# Patient Record
Sex: Female | Born: 1940 | Race: Black or African American | Hispanic: No | Smoking: Current every day smoker
Health system: Southern US, Community
[De-identification: ages and names within clinical notes are randomized; demographics above are authoritative.]

## PROBLEM LIST (undated history)

## (undated) DIAGNOSIS — I10 Essential (primary) hypertension: Secondary | ICD-10-CM

## (undated) DIAGNOSIS — J449 Chronic obstructive pulmonary disease, unspecified: Secondary | ICD-10-CM

---

## 2016-06-15 ENCOUNTER — Ambulatory Visit (HOSPITAL_COMMUNITY)
Admission: EM | Admit: 2016-06-15 | Discharge: 2016-06-15 | Disposition: A | Discharge status: Home / Self Care | Payer: Medicare Other | Attending: Family Medicine | Admitting: Family Medicine

## 2016-06-15 ENCOUNTER — Ambulatory Visit (INDEPENDENT_AMBULATORY_CARE_PROVIDER_SITE_OTHER): Payer: Medicare Other

## 2016-06-15 ENCOUNTER — Encounter (HOSPITAL_COMMUNITY): Payer: Self-pay | Admitting: Family Medicine

## 2016-06-15 DIAGNOSIS — S2232XG Fracture of one rib, left side, subsequent encounter for fracture with delayed healing: Secondary | ICD-10-CM

## 2016-06-15 HISTORY — DX: Essential (primary) hypertension: I10

## 2016-06-15 HISTORY — DX: Chronic obstructive pulmonary disease, unspecified: J44.9

## 2016-06-15 MED ORDER — MORPHINE SULFATE (PF) 2 MG/ML IV SOLN
INTRAVENOUS | Status: AC
  Filled 2016-06-15: qty 1

## 2016-06-15 MED ORDER — IBUPROFEN 800 MG PO TABS: 800.0000 mg | ORAL_TABLET | Freq: Three times a day (TID) | ORAL | 0 refills | Status: AC | PRN

## 2016-06-15 MED ORDER — MORPHINE SULFATE (PF) 2 MG/ML IV SOLN
2.0000 mg | Freq: Once | INTRAVENOUS | Status: AC
  Administered 2016-06-15: 2 mg via INTRAMUSCULAR

## 2016-06-15 MED ORDER — HYDROCODONE-ACETAMINOPHEN 5-325 MG PO TABS: 1.0000 | ORAL_TABLET | Freq: Four times a day (QID) | ORAL | 0 refills | Status: AC | PRN

## 2016-06-15 NOTE — ED Provider Notes (Signed)
CSN: [id number]     Arrival date & time 06/15/16  1046 History   First MD Initiated Contact with Patient 06/15/16 1211     Chief Complaint  Patient presents with  . Fall   (Consider location/radiation/quality/duration/timing/severity/associated sxs/prior Treatment) Mrs. Lemler is a 75 y.o female with history of HTN and COPD. She tripped and fell this morning on the concrete driveway, landed on her left side, and now presents to Urgent Care complaining of severe left rib pain, left shoulder pain, left face pain, and worsening in her chronic low back pain. Patient had no LOC. She denies headache, dizziness, or weakness. She is not on any blood thinners. She reports her rib pain is the most bothersome. She reports it hurts to move and hurts to breath. She rates her current pain as 8/10.       Past Medical History:  Diagnosis Date  . COPD (chronic obstructive pulmonary disease) (HCC)   . Hypertension    History reviewed. No pertinent surgical history. History reviewed. No pertinent family history. Social History  Substance Use Topics  . Smoking status: Current Every Day Smoker  . Smokeless tobacco: Never Used  . Alcohol use Not on file   OB History    No data available     Review of Systems  Constitutional: Negative for chills, fatigue and fever.  Respiratory: Positive for cough and shortness of breath.        Normally stays out of breath with coughing due to her COPD   Cardiovascular: Negative for chest pain and palpitations.  Gastrointestinal: Positive for abdominal pain. Negative for diarrhea, nausea and vomiting.  Musculoskeletal:       Positive for left rib pain, worsening in her chronic back pain, and left shoulder pain. Reports the rib pain is the most bothersome  Neurological: Negative for dizziness, weakness and headaches.    Allergies  Codeine and Sulfa antibiotics  Home Medications   Prior to Admission medications   Medication Sig Start Date End Date Taking?  Authorizing Provider  albuterol (PROVENTIL HFA;VENTOLIN HFA) 108 (90 Base) MCG/ACT inhaler Inhale into the lungs every 6 (six) hours as needed for wheezing or shortness of breath.   Yes Historical Provider, MD  FLUoxetine (PROZAC) 10 MG capsule Take 10 mg by mouth daily.   Yes Historical Provider, MD  hydrOXYzine (ATARAX/VISTARIL) 25 MG tablet Take 25 mg by mouth 1 day or 1 dose.   Yes Historical Provider, MD  lisinopril (PRINIVIL,ZESTRIL) 10 MG tablet Take 10 mg by mouth daily.   Yes Historical Provider, MD  meloxicam (MOBIC) 7.5 MG tablet Take 7.5 mg by mouth daily.   Yes Historical Provider, MD  omeprazole (PRILOSEC) 20 MG capsule Take 20 mg by mouth 2 (two) times daily before a meal.   Yes Historical Provider, MD  HYDROcodone-acetaminophen (NORCO/VICODIN) 5-325 MG tablet Take 1 tablet by mouth every 6 (six) hours as needed. 06/15/16 06/20/16  Lucia Estelle, NP  ibuprofen (ADVIL,MOTRIN) 800 MG tablet Take 1 tablet (800 mg total) by mouth 3 (three) times daily as needed. 06/15/16 06/20/16  Lucia Estelle, NP   Meds Ordered and Administered this Visit   Medications  morphine 2 MG/ML injection 2 mg (2 mg Intramuscular Given 06/15/16 1228)    BP 175/65   Pulse 76   Temp 98.6 F (37 C)   Resp 18   SpO2 100%  No data found.   Physical Exam  Constitutional: She is oriented to person, place, and time. She appears well-developed and well-nourished.  Appears to be in a lot of pain  HENT:  Head: Normocephalic and atraumatic.  Small amount of swelling and bruising noted over the left zygomatic bone region.   Eyes: EOM are normal. Pupils are equal, round, and reactive to light.  Neck: Normal range of motion. Neck supple.  Cardiovascular: Normal rate, regular rhythm and normal heart sounds.   Pulmonary/Chest: Effort normal and breath sounds normal. No respiratory distress.  Abdominal: Soft. Bowel sounds are normal. There is no tenderness.  Musculoskeletal:  No shoulder or rib deformity noted. No  swelling noted. Has severe tenderness and pain upon palpation over her left rib cage, has severe pain with left shoulder ROM; however does have passive full ROM  Neurological: She is alert and oriented to person, place, and time. No cranial nerve deficit.  Skin: Skin is warm.  Nursing note and vitals reviewed.   Urgent Care Course   Clinical Course    Procedures (including critical care time)  Labs Review Labs Reviewed - No data to display  Imaging Review Dg Ribs Unilateral W/chest Left  Result Date: 06/15/2016 CLINICAL DATA:  Patient fell this morning on concrete with left-sided rib pain. EXAM: LEFT RIBS AND CHEST - 3+ VIEW COMPARISON:  None. FINDINGS: The lungs are clear wiithout focal pneumonia, edema, pneumothorax or pleural effusion. Interstitial markings are diffusely coarsened with chronic features. Cardiopericardial silhouette is at upper limits of normal for size. Radio-opaque marker has been placed on the skin at the site of patient concern. Oblique views of the left ribs show an apparent nonacute fracture of the posterior lateral left fourth rib, a location remote from the localization marker. No acute left-sided rib fracture is identified. IMPRESSION: Old left fourth rib fracture.  No acute abnormality. Electronically Signed   By: Kennith CenterEric  Mansell M.D.   On: 06/15/2016 13:10   Dg Shoulder Left  Result Date: 06/15/2016 CLINICAL DATA:  Status post fall onto concrete surface is morning with persistent left shoulder pain. EXAM: LEFT SHOULDER - 2+ VIEW COMPARISON:  None in PACs FINDINGS: The bones are subjectively osteopenic. There are large spurs arising from the superior periarticular regions of the distal clavicle and the acromion. There is [facility] the [facility] space. No acute clavicular or acromial fracture is observed. There may be an old fracture through the superior aspect of the acromial osteophyte. The subacromial subdeltoid space is normal. The humeral head and bony glenoid  appear normal. The observed portions of the upper left ribs are normal. IMPRESSION: No acute fracture is observed. There is significant degenerative change of the [facility] joint. An old fracture through a large osteophyte arising from the superior periarticular region of the acromion may be present. Electronically Signed   By: David  Swazil[place]Jordan M.D.   On: 06/15/2016 12:57        MDM   1. Rib fracture, left, with delayed healing, subsequent encounter    Morphine 2mg  was given for her pain; patient reports improvement in her pain after the morphine and was able to complete the xray. Xray of left rib shows an old left fourth rib fracture. Xray of the left shoulder shows no acute fracture however there is significant degenerative changes of the [facility]. An old fracture through a large osteophyte arising from the superior periarticular region of the acromion may be present as well. Patient's family member informed of this finding. No splinting is required at this point. Instructed to f/u with an orthopaedic specialist. Information for [facility] given; informed to call  today to make an appointment. Xray discs also given in case patient wants to f/u with other orthopedic specialist that is out of our health system. Prescriptions for ibuprofen and hydrocodone given. Informed to take ibuprofen TID as needed for pain, Supplement with Hydrocodone as needed if ibuprofen does not help. Use ice over the left facial bone to help with the swelling. Reassured that no further testing is needed at this point. Patient did not take her BP med this morning, which explains why it is elevated; informed to take her BP med when she gets home and continue to monitor her BP at home.  All questions were answered. Discharge instruction given.    Lucia Estelle, NP 06/15/16 1425

## 2016-06-15 NOTE — Discharge Instructions (Signed)
Go ahead and call today to schedule an appointment to see an orthopedic doctor. Take Ibuprofen for pain, this will help with facial swelling as well. May supplement with hydrocodone for pain if ibuprofen does not help alone.

## 2016-06-15 NOTE — ED Triage Notes (Signed)
Pt here for trip anf fall yesterday injuring her whole left side. Pt mostly complaining of left rib, back and face pain.

## 2017-06-22 IMAGING — DX DG RIBS W/ CHEST 3+V*L*
5 series · 5 of 5 positions shown · non-contrast
Comparison: None.

CLINICAL DATA: Patient fell this morning on concrete with
left-sided rib pain.

EXAM:
LEFT RIBS AND CHEST - 3+ VIEW

[chest pa]
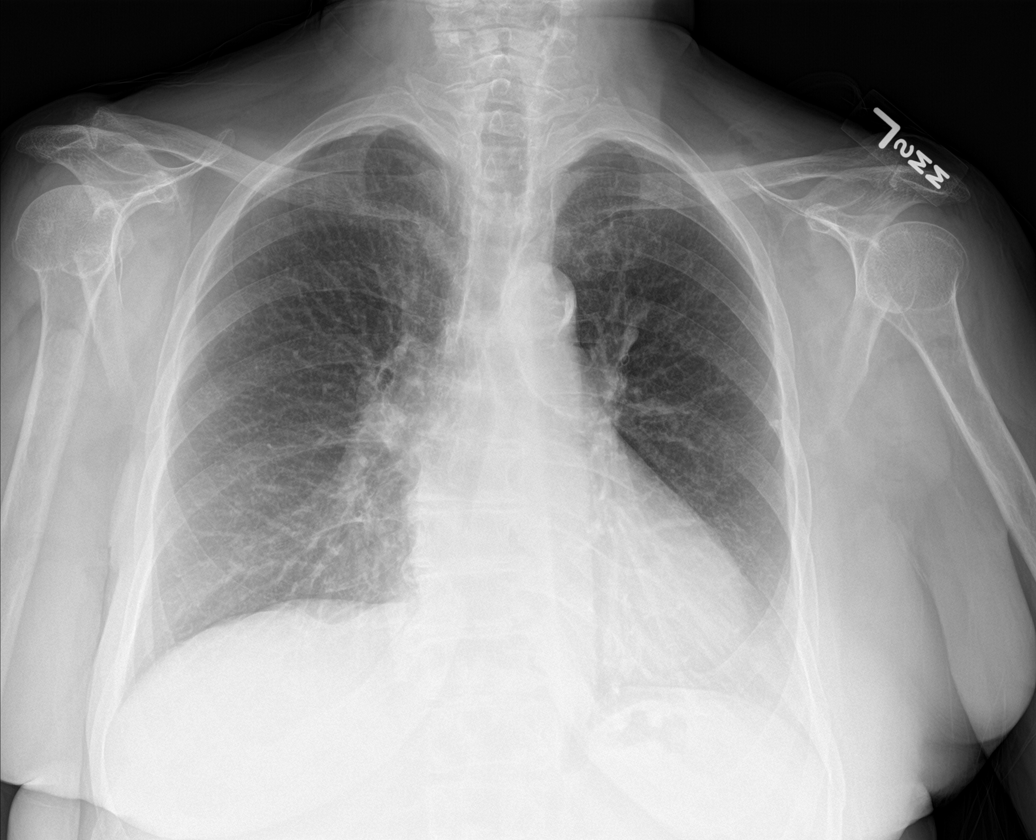

[rib obl (1 of 2)]
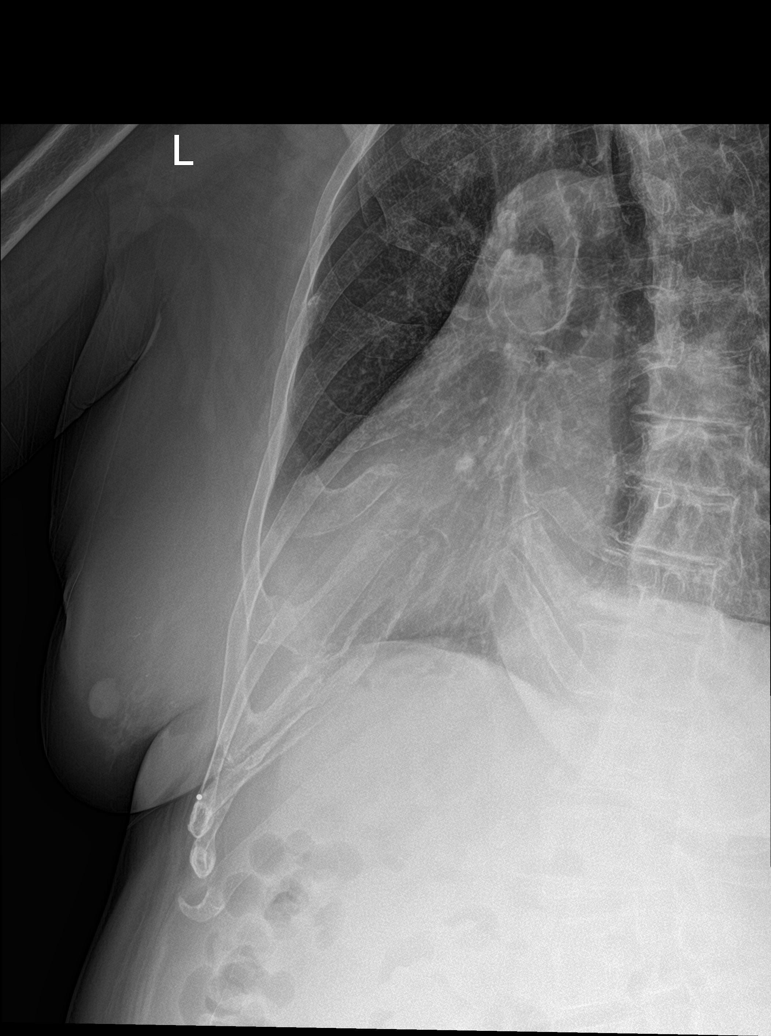

[rib obl (2 of 2)]
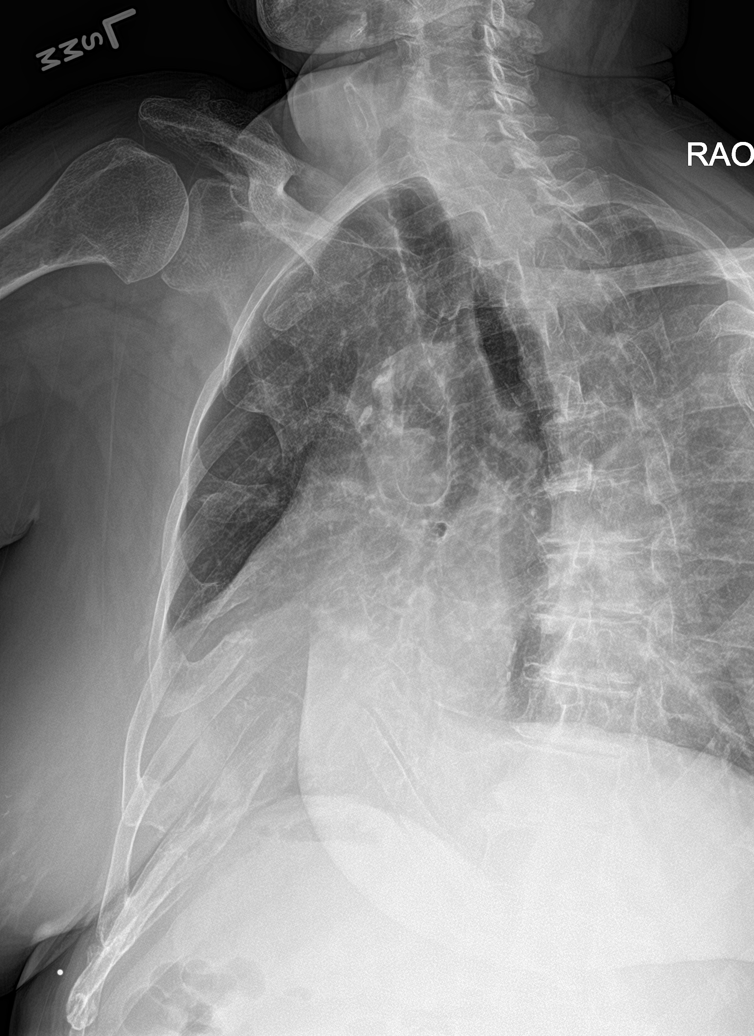

[rib pa (1 of 2)]
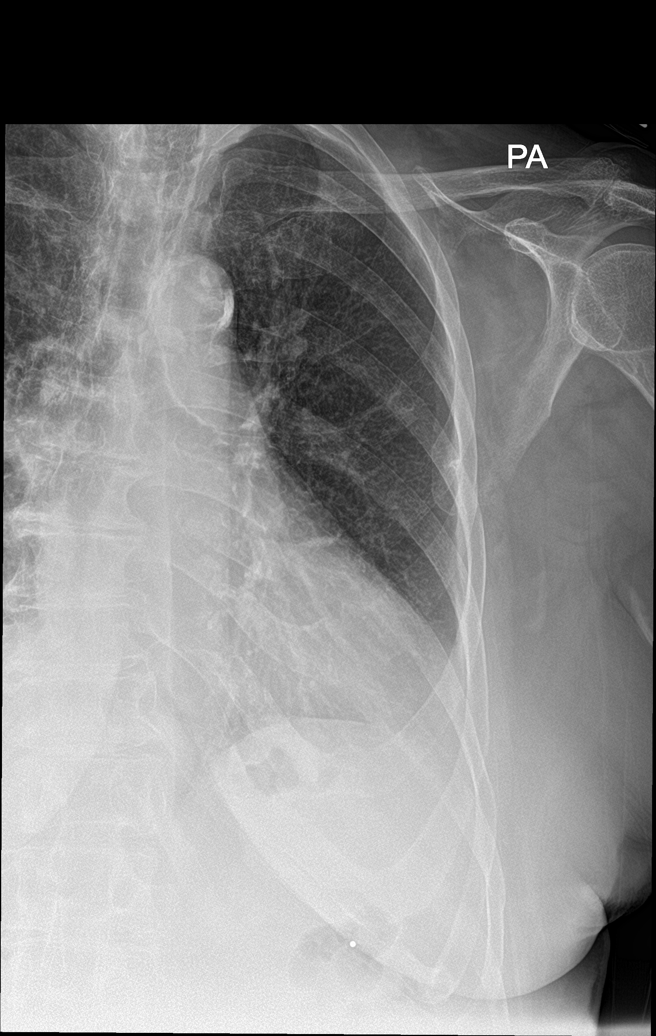

[rib pa (2 of 2)]
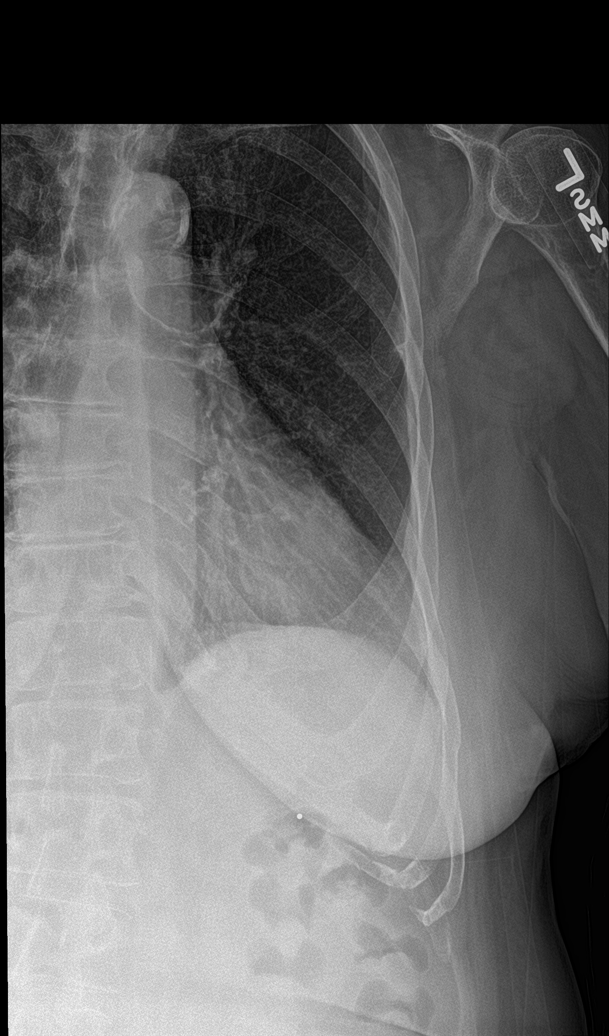

[5 of 5 positions shown; findings below may reference images not displayed]

FINDINGS: The lungs are clear wiithout focal pneumonia, edema, pneumothorax or
pleural effusion. Interstitial markings are diffusely coarsened with
chronic features. Cardiopericardial silhouette is at upper limits of
normal for size. Radio-opaque marker has been placed on the skin at
the site of patient concern. Oblique views of the left ribs show an
apparent nonacute fracture of the posterior lateral left fourth rib,
a location remote from the localization marker. No acute left-sided
rib fracture is identified.
IMPRESSION: Old left fourth rib fracture.  No acute abnormality.
